# Patient Record
Sex: Female | Born: 1960 | Race: White | Hispanic: No | Smoking: Former smoker
Health system: Southern US, Community
[De-identification: ages and names within clinical notes are randomized; demographics above are authoritative.]

## PROBLEM LIST (undated history)

## (undated) ENCOUNTER — Inpatient Hospital Stay (HOSPITAL_COMMUNITY): Payer: PRIVATE HEALTH INSURANCE

## (undated) DIAGNOSIS — G4733 Obstructive sleep apnea (adult) (pediatric): Principal | ICD-10-CM

## (undated) DIAGNOSIS — J302 Other seasonal allergic rhinitis: Secondary | ICD-10-CM

## (undated) HISTORY — DX: Other seasonal allergic rhinitis: J30.2

## (undated) HISTORY — DX: Obstructive sleep apnea (adult) (pediatric): G47.33

## (undated) HISTORY — PX: CHOLECYSTECTOMY: SHX55

---

## 1997-10-13 ENCOUNTER — Other Ambulatory Visit: Admission: RE | Admit: 1997-10-13 | Discharge: 1997-10-13 | Payer: Self-pay | Admitting: Obstetrics and Gynecology

## 1999-03-11 ENCOUNTER — Other Ambulatory Visit: Admission: RE | Admit: 1999-03-11 | Discharge: 1999-03-11 | Payer: Self-pay | Admitting: Obstetrics and Gynecology

## 2000-04-20 ENCOUNTER — Other Ambulatory Visit: Admission: RE | Admit: 2000-04-20 | Discharge: 2000-04-20 | Payer: Self-pay | Admitting: Obstetrics and Gynecology

## 2001-05-03 ENCOUNTER — Other Ambulatory Visit: Admission: RE | Admit: 2001-05-03 | Discharge: 2001-05-03 | Payer: Self-pay | Admitting: Obstetrics and Gynecology

## 2002-05-27 ENCOUNTER — Other Ambulatory Visit: Admission: RE | Admit: 2002-05-27 | Discharge: 2002-05-27 | Payer: Self-pay | Admitting: Obstetrics and Gynecology

## 2003-06-18 ENCOUNTER — Other Ambulatory Visit: Admission: RE | Admit: 2003-06-18 | Discharge: 2003-06-18 | Payer: Self-pay | Admitting: Obstetrics and Gynecology

## 2004-07-21 ENCOUNTER — Other Ambulatory Visit: Admission: RE | Admit: 2004-07-21 | Discharge: 2004-07-21 | Payer: Self-pay | Admitting: Obstetrics and Gynecology

## 2004-12-30 ENCOUNTER — Encounter: Admission: RE | Admit: 2004-12-30 | Discharge: 2004-12-30 | Payer: Self-pay | Admitting: Obstetrics and Gynecology

## 2010-02-19 ENCOUNTER — Encounter: Payer: Self-pay | Admitting: Obstetrics and Gynecology

## 2013-02-25 ENCOUNTER — Encounter (HOSPITAL_COMMUNITY): Payer: Self-pay | Admitting: *Deleted

## 2013-02-28 NOTE — H&P (Signed)
Ihor AustinCelia Wesolowski  DICTATION # [id number] CSN# [id number]   Meriel PicaHOLLAND,Abi Shoults M, MD 02/28/2013 9:04 AM

## 2013-03-06 ENCOUNTER — Encounter (HOSPITAL_COMMUNITY): Payer: Self-pay

## 2013-03-12 MED ORDER — DEXTROSE 5 % IV SOLN
2.0000 g | INTRAVENOUS | Status: DC
  Filled 2013-03-12: qty 2

## 2013-03-13 ENCOUNTER — Encounter (HOSPITAL_COMMUNITY): Payer: Self-pay | Admitting: Anesthesiology

## 2013-03-13 ENCOUNTER — Ambulatory Visit (HOSPITAL_COMMUNITY)
Admission: RE | Admit: 2013-03-13 | Discharge: 2013-03-13 | Disposition: A | Discharge status: Home / Self Care | Payer: PRIVATE HEALTH INSURANCE | Source: Ambulatory Visit | Attending: Obstetrics and Gynecology | Admitting: Obstetrics and Gynecology

## 2013-03-13 ENCOUNTER — Encounter (HOSPITAL_COMMUNITY)
Admission: RE | Disposition: A | Discharge status: Home / Self Care | Payer: Self-pay | Source: Ambulatory Visit | Attending: Obstetrics and Gynecology

## 2013-03-13 SURGERY — DILATATION & CURETTAGE/HYSTEROSCOPY WITH TRUCLEAR
Anesthesia: Choice

## 2013-03-13 MED ORDER — LACTATED RINGERS IV SOLN: INTRAVENOUS | Status: DC

## 2013-03-13 SURGICAL SUPPLY — 21 items
ABLATOR ENDOMETRIAL BIPOLAR (ABLATOR) ×1 IMPLANT
BLADE INCISOR TRUC PLUS 2.9 (ABLATOR) IMPLANT
CANISTERS HI-FLOW 3000CC (CANNISTER) IMPLANT
CATH ROBINSON RED A/P 16FR (CATHETERS) ×1 IMPLANT
CLOTH BEACON ORANGE TIMEOUT ST (SAFETY) ×1 IMPLANT
CONTAINER PREFILL 10% NBF 60ML (FORM) ×2 IMPLANT
DRAPE HYSTEROSCOPY (DRAPE) ×1 IMPLANT
DRSG TELFA 3X8 NADH (GAUZE/BANDAGES/DRESSINGS) IMPLANT
GLOVE BIO SURGEON STRL SZ7 (GLOVE) ×2 IMPLANT
GOWN STRL REUS W/TWL LRG LVL3 (GOWN DISPOSABLE) ×2 IMPLANT
INCISOR TRUC PLUS BLADE 2.9 (ABLATOR)
KIT HYSTEROSCOPY TRUCLEAR (ABLATOR) IMPLANT
MORCELLATOR RECIP TRUCLEAR 4.0 (ABLATOR) IMPLANT
NDL SPNL 22GX3.5 QUINCKE BK (NEEDLE) ×1 IMPLANT
NEEDLE SPNL 22GX3.5 QUINCKE BK (NEEDLE) IMPLANT
PACK VAGINAL MINOR WOMEN LF (CUSTOM PROCEDURE TRAY) ×1 IMPLANT
PAD DRESSING TELFA 3X8 NADH (GAUZE/BANDAGES/DRESSINGS) ×1 IMPLANT
PAD OB MATERNITY 4.3X12.25 (PERSONAL CARE ITEMS) ×1 IMPLANT
SYR CONTROL 10ML LL (SYRINGE) ×1 IMPLANT
TOWEL OR 17X24 6PK STRL BLUE (TOWEL DISPOSABLE) ×2 IMPLANT
WATER STERILE IRR 1000ML POUR (IV SOLUTION) ×1 IMPLANT

## 2013-03-13 NOTE — Anesthesia Preprocedure Evaluation (Deleted)
Anesthesia Evaluation Anesthesia Physical Anesthesia Plan  ASA:   Anesthesia Plan:    Post-op Pain Management:    Induction:   Airway Management Planned:   Additional Equipment:   Intra-op Plan:   Post-operative Plan:   Informed Consent:   Plan Discussed with:   Anesthesia Plan Comments: (Case cancelled. Pt on phenteramine for weight loss. Current recommendations are to be off this drug 1 week prior to surgery to avoid hypertensive crisis)        Anesthesia Quick Evaluation

## 2013-03-13 NOTE — Progress Notes (Signed)
Pt surgery cancelled per Dr. Rosana Hoesarnigan. Pt has not been off phentermine for two weeks

## 2013-03-18 ENCOUNTER — Encounter (HOSPITAL_COMMUNITY): Payer: Self-pay

## 2013-03-20 NOTE — H&P (Signed)
NAMIhor Austin:  Rhonda Potts, Rhonda Potts                   ACCOUNT NO.:  [id number]  MEDICAL RECORD NO.:  [id number]  LOCATION:                            FACILITY:  [facility]  PHYSICIAN:  Duke Salviaichard M. Marcelle OverlieHolland, M.D.DATE OF BIRTH:  08/06/1960  DATE OF ADMISSION:  03/13/2013 DATE OF DISCHARGE:                             HISTORY & PHYSICAL   CHIEF COMPLAINT:  Menorrhagia.  HPI:  A 53 year old, divorced white female, G2, P2, is not currently sexually active.  She has been followed over the last several years in our office for routine exams by our nurse practitioner, with complaints of worsening problems related to heavy periods in October of 2014.  FSH was checked was 5.4 with thyroid profile that was normal.  SHT performed on February 13, 2013, demonstrated several small polyps noted.  She presents now for D and C, hysteroscopy, with Truclear resection of endometrial polyps and NovaSure endometrial ablation.  This procedure was discussed including specific risks related to bleeding, infection, other complications that may require additional surgery along with her expected recovery time.  PAST MEDICAL HISTORY:  ALLERGIES:  KEFLEX.  CURRENT MEDICATIONS:  Over-the-counter vitamin supplements.  PAST SURGICAL HISTORY:  Two prior cesarean sections in 1990 and 1995, cholecystectomy in 1995.  REVIEW OF SYSTEMS:  Significant for past history of HSV, UTI, and gallbladder disease.  FAMILY HISTORY:  Significant for heart disease, asthma, thyroid disease, arthritis, and hypertension.  SOCIAL HISTORY:  She is divorced.  Denies tobacco or drug use, social alcohol use.  PHYSICAL EXAMINATION:  VITAL SIGNS:  Temp 98.8, blood pressure 106/72. HEENT:  Unremarkable. NECK:  Supple without masses. LUNGS:  Clear. CARDIOVASCULAR:  Regular rate and rhythm without murmurs, rubs, gallops. BREASTS:  Without masses. ABDOMEN:  Soft, flat, nontender. PELVIS:  Vulva, vagina, cervix normal.  Uterus mid position, normal size,  mobile, adnexa negative. EXTREMITIES:  Unremarkable. NEUROLOGIC:  Unremarkable.  IMPRESSION:  Menorrhagia with endometrial polyps noted.  PLAN:  D and C, hysteroscopy, NovaSure endometrial ablation procedure and risks discussed as above.     Rhonda Potts M. Marcelle OverlieHolland, M.D.     RMH/MEDQ  D:  02/28/2013  T:  03/01/2013  Job:  [id number]

## 2013-03-24 ENCOUNTER — Encounter (HOSPITAL_COMMUNITY): Payer: Self-pay | Admitting: Pharmacist

## 2013-04-03 MED ORDER — GENTAMICIN SULFATE 40 MG/ML IJ SOLN
INTRAVENOUS | Status: AC
  Administered 2013-04-04: 320 mL via INTRAVENOUS
  Filled 2013-04-03: qty 8

## 2013-04-04 ENCOUNTER — Encounter (HOSPITAL_COMMUNITY): Payer: Self-pay | Admitting: Anesthesiology

## 2013-04-04 ENCOUNTER — Encounter (HOSPITAL_COMMUNITY)
Admission: RE | Disposition: A | Discharge status: Home / Self Care | Payer: Self-pay | Source: Ambulatory Visit | Attending: Obstetrics and Gynecology

## 2013-04-04 ENCOUNTER — Ambulatory Visit (HOSPITAL_COMMUNITY): Payer: PRIVATE HEALTH INSURANCE | Admitting: Anesthesiology

## 2013-04-04 ENCOUNTER — Encounter (HOSPITAL_COMMUNITY): Payer: PRIVATE HEALTH INSURANCE | Admitting: Anesthesiology

## 2013-04-04 ENCOUNTER — Ambulatory Visit (HOSPITAL_COMMUNITY)
Admission: RE | Admit: 2013-04-04 | Discharge: 2013-04-04 | Disposition: A | Discharge status: Home / Self Care | Payer: PRIVATE HEALTH INSURANCE | Source: Ambulatory Visit | Attending: Obstetrics and Gynecology | Admitting: Obstetrics and Gynecology

## 2013-04-04 DIAGNOSIS — N840 Polyp of corpus uteri: Secondary | ICD-10-CM | POA: Insufficient documentation

## 2013-04-04 DIAGNOSIS — N920 Excessive and frequent menstruation with regular cycle: Secondary | ICD-10-CM | POA: Insufficient documentation

## 2013-04-04 HISTORY — PX: DILATATION & CURETTAGE/HYSTEROSCOPY WITH TRUECLEAR: SHX6353

## 2013-04-04 HISTORY — PX: NOVASURE ABLATION: SHX5394

## 2013-04-04 LAB — CBC
HEMATOCRIT: 35.2 % — AB (ref 36.0–46.0)
HEMOGLOBIN: 10.9 g/dL — AB (ref 12.0–15.0)
MCH: 25.6 pg — ABNORMAL LOW (ref 26.0–34.0)
MCHC: 31 g/dL (ref 30.0–36.0)
MCV: 82.8 fL (ref 78.0–100.0)
Platelets: 210 10*3/uL (ref 150–400)
RBC: 4.25 MIL/uL (ref 3.87–5.11)
RDW: 14.1 % (ref 11.5–15.5)
WBC: 5.6 10*3/uL (ref 4.0–10.5)

## 2013-04-04 LAB — PREGNANCY, URINE: PREG TEST UR: NEGATIVE

## 2013-04-04 SURGERY — DILATATION & CURETTAGE/HYSTEROSCOPY WITH TRUCLEAR
Anesthesia: General | Site: Vagina

## 2013-04-04 MED ORDER — PROPOFOL 10 MG/ML IV EMUL
INTRAVENOUS | Status: AC
  Filled 2013-04-04: qty 20

## 2013-04-04 MED ORDER — LIDOCAINE HCL (CARDIAC) 20 MG/ML IV SOLN
INTRAVENOUS | Status: DC | PRN
  Administered 2013-04-04: 40 mg via INTRAVENOUS

## 2013-04-04 MED ORDER — LIDOCAINE HCL 1 % IJ SOLN
INTRAMUSCULAR | Status: AC
  Filled 2013-04-04: qty 40

## 2013-04-04 MED ORDER — MIDAZOLAM HCL 2 MG/2ML IJ SOLN
INTRAMUSCULAR | Status: AC
  Filled 2013-04-04: qty 2

## 2013-04-04 MED ORDER — FENTANYL CITRATE 0.05 MG/ML IJ SOLN
INTRAMUSCULAR | Status: AC
  Filled 2013-04-04: qty 2

## 2013-04-04 MED ORDER — LACTATED RINGERS IR SOLN
Status: DC | PRN
  Administered 2013-04-04: 3000 mL

## 2013-04-04 MED ORDER — ONDANSETRON HCL 4 MG/2ML IJ SOLN
INTRAMUSCULAR | Status: DC | PRN
  Administered 2013-04-04: 4 mg via INTRAVENOUS

## 2013-04-04 MED ORDER — LIDOCAINE HCL (CARDIAC) 20 MG/ML IV SOLN
INTRAVENOUS | Status: AC
  Filled 2013-04-04: qty 5

## 2013-04-04 MED ORDER — ONDANSETRON HCL 4 MG/2ML IJ SOLN
INTRAMUSCULAR | Status: AC
  Filled 2013-04-04: qty 2

## 2013-04-04 MED ORDER — HYDROCODONE-IBUPROFEN 7.5-200 MG PO TABS: 1.0000 | ORAL_TABLET | Freq: Three times a day (TID) | ORAL | Status: DC | PRN

## 2013-04-04 MED ORDER — KETOROLAC TROMETHAMINE 30 MG/ML IJ SOLN
INTRAMUSCULAR | Status: DC | PRN
  Administered 2013-04-04: 30 mg via INTRAVENOUS

## 2013-04-04 MED ORDER — FENTANYL CITRATE 0.05 MG/ML IJ SOLN
INTRAMUSCULAR | Status: DC | PRN
  Administered 2013-04-04 (×2): 50 ug via INTRAVENOUS

## 2013-04-04 MED ORDER — SILVER NITRATE-POT NITRATE 75-25 % EX MISC
CUTANEOUS | Status: DC | PRN
  Administered 2013-04-04: 2

## 2013-04-04 MED ORDER — MIDAZOLAM HCL 2 MG/2ML IJ SOLN
INTRAMUSCULAR | Status: DC | PRN
  Administered 2013-04-04: 2 mg via INTRAVENOUS

## 2013-04-04 MED ORDER — LACTATED RINGERS IV SOLN
INTRAVENOUS | Status: DC
  Administered 2013-04-04: 08:00:00 via INTRAVENOUS

## 2013-04-04 MED ORDER — SODIUM CHLORIDE 0.9 % IR SOLN
Status: DC | PRN
  Administered 2013-04-04: 3000 mL

## 2013-04-04 MED ORDER — LIDOCAINE HCL 1 % IJ SOLN
INTRAMUSCULAR | Status: DC | PRN
  Administered 2013-04-04: 10 mL

## 2013-04-04 MED ORDER — PROPOFOL 10 MG/ML IV BOLUS
INTRAVENOUS | Status: DC | PRN
  Administered 2013-04-04: 170 mg via INTRAVENOUS

## 2013-04-04 MED ORDER — KETOROLAC TROMETHAMINE 30 MG/ML IJ SOLN
INTRAMUSCULAR | Status: AC
  Filled 2013-04-04: qty 1

## 2013-04-04 SURGICAL SUPPLY — 21 items
ABLATOR ENDOMETRIAL BIPOLAR (ABLATOR) ×2 IMPLANT
BLADE INCISOR TRUC PLUS 2.9 (ABLATOR) IMPLANT
CANISTERS HI-FLOW 3000CC (CANNISTER) ×2 IMPLANT
CATH ROBINSON RED A/P 16FR (CATHETERS) ×2 IMPLANT
CLOTH BEACON ORANGE TIMEOUT ST (SAFETY) ×2 IMPLANT
CONTAINER PREFILL 10% NBF 60ML (FORM) ×4 IMPLANT
DRAPE HYSTEROSCOPY (DRAPE) ×2 IMPLANT
DRSG TELFA 3X8 NADH (GAUZE/BANDAGES/DRESSINGS) ×2 IMPLANT
GLOVE BIO SURGEON STRL SZ7 (GLOVE) ×4 IMPLANT
GOWN STRL REUS W/TWL LRG LVL3 (GOWN DISPOSABLE) ×4 IMPLANT
INCISOR TRUC PLUS BLADE 2.9 (ABLATOR) ×2
KIT HYSTEROSCOPY TRUCLEAR (ABLATOR) ×1 IMPLANT
MORCELLATOR RECIP TRUCLEAR 4.0 (ABLATOR) IMPLANT
NDL SPNL 22GX3.5 QUINCKE BK (NEEDLE) ×1 IMPLANT
NEEDLE SPNL 22GX3.5 QUINCKE BK (NEEDLE) ×2 IMPLANT
PACK VAGINAL MINOR WOMEN LF (CUSTOM PROCEDURE TRAY) ×2 IMPLANT
PAD DRESSING TELFA 3X8 NADH (GAUZE/BANDAGES/DRESSINGS) ×1 IMPLANT
PAD OB MATERNITY 4.3X12.25 (PERSONAL CARE ITEMS) ×2 IMPLANT
SYR CONTROL 10ML LL (SYRINGE) ×2 IMPLANT
TOWEL OR 17X24 6PK STRL BLUE (TOWEL DISPOSABLE) ×4 IMPLANT
WATER STERILE IRR 1000ML POUR (IV SOLUTION) ×2 IMPLANT

## 2013-04-04 NOTE — Transfer of Care (Signed)
Immediate Anesthesia Transfer of Care Note  Patient: Rhonda Potts  Procedure(s) Performed: Procedure(s): DILATATION & CURETTAGE/HYSTEROSCOPY WITH TRUCLEAR (N/A) NOVASURE ABLATION (N/A)  Patient Location: PACU  Anesthesia Type:General  Level of Consciousness: awake, alert , oriented and patient cooperative  Airway & Oxygen Therapy: Patient Spontanous Breathing and Patient connected to nasal cannula oxygen  Post-op Assessment: Report given to PACU RN and Post -op Vital signs reviewed and stable  Post vital signs: Reviewed and stable  Complications: No apparent anesthesia complications

## 2013-04-04 NOTE — Progress Notes (Signed)
The patient was re-examined with no change in status 

## 2013-04-04 NOTE — Op Note (Signed)
Preoperative diagnosis: Endometrial polyp, menorrhagia  Postoperative diagnosis: Same  Procedure: D&C, hysteroscopy, resection of endometrial polyp with true clear, NovaSure endometrial ablation  Surgeon: Marcelle OverlieHolland  Anesthesia: Gen.  EBL: Less than 10 cc  Specimens removed: Endometrial polyp, to pathology  Procedure and findings:  The patient was taken the operating room after an adequate level of general anesthesia was obtained with the patient's legs in stirrups the perineum and vagina prepped and draped in the usual fashion for D&C. The bladder was drained. EUA carried out, uterus upper limit normal size mid position, adnexa negative. Appropriate timeout for taken at that point.  Speculum was positioned, cervix was grasped with a tenaculum, paracervical block was then created by infiltrating at 3 and 9:00 submucosally 5-7 cc 1% Xylocaine at each site after negative aspiration. The uterus is then sounded to 10 cm, progressively dilated to a 27-29 Pratt dilator, the continuous flow hysteroscope was inserted, the tubal ostia could be seen easily, endometrium was very thin except for the presence of a well-defined left cornual polyp, tissue extractor was then used to remove this down to the surrounding level of the endometrium this was sent to pathology the scope was pulled back for a panoramic view no other abnormalities were noted. The NovaSure device was then inserted with a cavity length of 6.5, initial appointment was reading right a 2.5 on the with, this was retracted reinserted easily to see if we could duplicate the width measurement and in fact it was only with only deployed to right a 2.5 cm. Past the CO2 testing, treatment cycle well-tolerated the instrument was removed, she tolerated this well received IV Toradol went to recovery room in good condition.  Dictated with [facility]  Tierre Netto M. Milana ObeyHolland M.D.

## 2013-04-04 NOTE — Discharge Instructions (Signed)

## 2013-04-04 NOTE — Anesthesia Preprocedure Evaluation (Addendum)

## 2013-04-04 NOTE — Anesthesia Postprocedure Evaluation (Signed)
  Anesthesia Post-op Note  Patient: Rhonda Potts  Procedure(s) Performed: Procedure(s): DILATATION & CURETTAGE/HYSTEROSCOPY WITH TRUCLEAR (N/A) NOVASURE ABLATION (N/A) Patient is awake and responsive. Pain and nausea are reasonably well controlled. Vital signs are stable and clinically acceptable. Oxygen saturation is clinically acceptable. There are no apparent anesthetic complications at this time. Patient is ready for discharge.

## 2013-04-07 ENCOUNTER — Encounter (HOSPITAL_COMMUNITY): Payer: Self-pay | Admitting: Obstetrics and Gynecology

## 2013-05-08 NOTE — H&P (Signed)
HISTORY & PHYSICAL  CHIEF COMPLAINT: Menorrhagia.  HPI: A 53 year old, divorced white female, G2, P2, is not currently  sexually active. She has been followed over the last several years in  our office for routine exams by our nurse practitioner, with complaints  of worsening problems related to heavy periods in October of 2014. FSH  was checked was 5.4 with thyroid profile that was normal. SHT performed  on February 13, 2013, demonstrated several small polyps noted. She  presents now for D and C, hysteroscopy, with Truclear resection of  endometrial polyps and NovaSure endometrial ablation. This procedure  was discussed including specific risks related to bleeding, infection,  other complications that may require additional surgery along with her  expected recovery time.  PAST MEDICAL HISTORY:  ALLERGIES: KEFLEX.  CURRENT MEDICATIONS: Over-the-counter vitamin supplements.  PAST SURGICAL HISTORY: Two prior cesarean sections in 1990 and 1995,  cholecystectomy in 1995.  REVIEW OF SYSTEMS: Significant for past history of HSV, UTI, and  gallbladder disease.  FAMILY HISTORY: Significant for heart disease, asthma, thyroid disease,  arthritis, and hypertension.  SOCIAL HISTORY: She is divorced. Denies tobacco or drug use, social  alcohol use.  PHYSICAL EXAMINATION: VITAL SIGNS: Temp 98.8, blood pressure 106/72.  HEENT: Unremarkable.  NECK: Supple without masses.  LUNGS: Clear.  CARDIOVASCULAR: Regular rate and rhythm without murmurs, rubs, gallops.  BREASTS: Without masses.  ABDOMEN: Soft, flat, nontender.  PELVIS: Vulva, vagina, cervix normal. Uterus mid position, normal  size, mobile, adnexa negative.  EXTREMITIES: Unremarkable.  NEUROLOGIC: Unremarkable.  IMPRESSION: Menorrhagia with endometrial polyps noted.  PLAN: D and C, hysteroscopy, NovaSure endometrial ablation procedure  and risks discussed as above.  Rhonda Potts, M.D.

## 2013-12-09 ENCOUNTER — Other Ambulatory Visit: Payer: Self-pay | Admitting: Obstetrics and Gynecology

## 2013-12-10 LAB — CYTOLOGY - PAP

## 2014-02-12 ENCOUNTER — Encounter (HOSPITAL_COMMUNITY): Payer: Self-pay | Admitting: Obstetrics and Gynecology

## 2014-03-01 ENCOUNTER — Ambulatory Visit (INDEPENDENT_AMBULATORY_CARE_PROVIDER_SITE_OTHER): Payer: 59 | Admitting: Family Medicine

## 2014-03-01 VITALS — BP 102/66 | HR 83 | Temp 98.4°F | Resp 18 | Ht 63.5 in | Wt 164.8 lb

## 2014-03-01 DIAGNOSIS — R300 Dysuria: Secondary | ICD-10-CM

## 2014-03-01 LAB — POCT URINALYSIS DIPSTICK
Bilirubin, UA: NEGATIVE
Glucose, UA: NEGATIVE
Ketones, UA: NEGATIVE
Nitrite, UA: NEGATIVE
Protein, UA: NEGATIVE
Spec Grav, UA: <=1.005
Urobilinogen, UA: 0.2
pH, UA: 6

## 2014-03-01 LAB — POCT UA - MICROSCOPIC ONLY
Casts, Ur, LPF, POC: NEGATIVE
Crystals, Ur, HPF, POC: NEGATIVE
Mucus, UA: NEGATIVE
Yeast, UA: NEGATIVE

## 2014-03-01 MED ORDER — CIPROFLOXACIN HCL 500 MG PO TABS: 500.0000 mg | ORAL_TABLET | Freq: Two times a day (BID) | ORAL | Status: DC

## 2014-03-01 NOTE — Patient Instructions (Signed)

## 2014-03-01 NOTE — Progress Notes (Addendum)
° °  Subjective:    Patient ID: Rhonda Potts, female    DOB: [birth year], 54 y.o.   MRN: [id number] This chart was scribed for Elvina SidleKurt Lauenstein, MD by Littie Deedsichard Sun, Medical Scribe. This patient was seen in Room 3 and the patient's care was started at 10:00 AM.   HPI HPI Comments: Rhonda Potts is a 54 y.o. female who presents to the [facility] complaining of hematuria with clotting that started yesterday morning. Patient also reports having associated lower back pain (worse on right side) and also felt lethargic yesterday. Her symptoms have been pro worsening since yesterday. She has not tried any treatments. Patient denies nausea, vomiting, fever, and chills. She states she has had UTIs before, but her symptoms now do not feel like UTIs in the past - she had not had clotting before and she normally gets burning dysuria with UTI.  Patient works in [employer]. She is supposed to go out-of-town tomorrow for 3 days for work.   Review of Systems  Constitutional: Positive for fatigue. Negative for fever and chills.  Gastrointestinal: Negative for nausea and vomiting.  Genitourinary: Positive for hematuria.  Musculoskeletal: Positive for back pain.       Objective:   Physical Exam CONSTITUTIONAL: Well developed/well nourished HEAD: Normocephalic/atraumatic EYES: EOM/PERRL ENMT: Mucous membranes moist NECK: supple no meningeal signs SPINE: entire spine nontender  ABDOMEN: soft, nontender, no rebound or guarding GU: no cva tenderness NEURO: Pt is awake/alert, moves all extremitiesx4 EXTREMITIES: pulses normal, full ROM SKIN: warm, color normal PSYCH: no abnormalities of mood noted Results for orders placed or performed in visit on 03/01/14  POCT urinalysis dipstick  Result Value Ref Range   Color, UA light yellow    Clarity, UA cloudy    Glucose, UA neg    Bilirubin, UA neg    Ketones, UA neg    Spec Grav, UA <=1.005    Blood, UA large    pH, UA 6.0    Protein, UA neg    Urobilinogen, UA 0.2    Nitrite, UA neg    Leukocytes, UA moderate (2+)   POCT UA - Microscopic Only  Result Value Ref Range   WBC, Ur, HPF, POC TNTC    RBC, urine, microscopic 15-20    Bacteria, U Microscopic 1+    Mucus, UA neg    Epithelial cells, urine per micros 2-5    Crystals, Ur, HPF, POC neg    Casts, Ur, LPF, POC neg    Yeast, UA neg         Assessment & Plan:   This chart was scribed in my presence and reviewed by me personally.    ICD-9-CM ICD-10-CM   1. Dysuria 788.1 R30.0 POCT urinalysis dipstick     POCT UA - Microscopic Only     Urine culture     ciprofloxacin (CIPRO) 500 MG tablet     Signed, Elvina SidleKurt Lauenstein, MD

## 2014-03-03 LAB — URINE CULTURE: Colony Count: 75000

## 2014-05-21 ENCOUNTER — Ambulatory Visit (INDEPENDENT_AMBULATORY_CARE_PROVIDER_SITE_OTHER): Payer: 59 | Admitting: Physician Assistant

## 2014-05-21 VITALS — BP 102/70 | HR 75 | Temp 98.1°F | Resp 18 | Ht 64.5 in | Wt 160.0 lb

## 2014-05-21 DIAGNOSIS — M79644 Pain in right finger(s): Secondary | ICD-10-CM | POA: Diagnosis not present

## 2014-05-21 DIAGNOSIS — L02511 Cutaneous abscess of right hand: Secondary | ICD-10-CM | POA: Diagnosis not present

## 2014-05-21 MED ORDER — DOXYCYCLINE HYCLATE 100 MG PO CAPS: 100.0000 mg | ORAL_CAPSULE | Freq: Two times a day (BID) | ORAL | Status: DC

## 2014-05-21 NOTE — Progress Notes (Signed)
  Medical screening examination/treatment/procedure(s) were performed by non-physician practitioner and as supervising physician I was immediately available for consultation/collaboration.     

## 2014-05-21 NOTE — Progress Notes (Signed)
   Subjective:    Patient ID: Rhonda Potts, female    DOB: [birth year], 54 y.o.   MRN: [id number]  HPI Patient presents for possible finger infection of middle finger of right hand that started 1 week ago, but has become more red and painful over the past 3 days. Pain does not radiate. Recalls fingernail tearing down to cuticle 1 week ago. Finger is now warm to touch and a little swollen. Has been soaking finger in vinegar and water with not change. Worse throughout today due to working on project and has been doing a lot of typing. Denies loss of function/sensation/ROM, numbness, or weakness.    Review of Systems  Constitutional: Negative for fever and chills.  Skin: Positive for color change and wound. Negative for pallor.  Neurological: Negative for weakness and numbness.       Objective:   Physical Exam  Constitutional: She is oriented to person, place, and time. She appears well-developed and well-nourished. No distress.  Blood pressure 102/70, pulse 75, temperature 98.1 F (36.7 C), temperature source Oral, resp. rate 18, height 5' 4.5" (1.638 m), weight 160 lb (72.576 kg), last menstrual period 05/13/2014, SpO2 99 %.  HENT:  Head: Normocephalic and atraumatic.  Right Ear: External ear normal.  Left Ear: External ear normal.  Eyes: Conjunctivae are normal. Right eye exhibits no discharge. Left eye exhibits no discharge.  Neck: Neck supple.  Pulmonary/Chest: Effort normal.  Lymphadenopathy:    She has no cervical adenopathy.  Neurological: She is alert and oriented to person, place, and time.  Skin: Skin is warm and dry. No rash noted. She is not diaphoretic. No erythema. No pallor.  Middle finger of right hand: Tip of finger erythematous to DIP and warm to touch. Tender to palpation. Small area of fluctuance along lateral border of nail. Purulence expressed with deep palpation.    Procedure Consent obtained. 3cc 1% lido metacarpal block 1/4 cc local anesthesia. 1/2 cm incision  made. Purulence expressed and culture obtained. Wound explored. 1/4 plain packing placed. Clean dressing placed. Care instructions given.    Assessment & Plan:  1. Abscess of finger, right 2. Finger pain, right Warm compress 3-4x daily for 15 minutes. Ibuprofen for pain. RTC 05/23/14 for wound care. - doxycycline (VIBRAMYCIN) 100 MG capsule; Take 1 capsule (100 mg total) by mouth 2 (two) times daily.  Dispense: 20 capsule; Refill: 0 - Wound culture    Janan Ridgeishira Kimaria Struthers PA-C  [facility] 05/21/2014 3:41 PM

## 2014-05-21 NOTE — Patient Instructions (Signed)
Ibuprofen for pain. Heating pad/compress 15-20 min 3-4 times daily.

## 2014-05-24 ENCOUNTER — Ambulatory Visit (INDEPENDENT_AMBULATORY_CARE_PROVIDER_SITE_OTHER): Payer: 59 | Admitting: Internal Medicine

## 2014-05-24 VITALS — BP 118/60 | HR 82 | Temp 98.0°F | Resp 18

## 2014-05-24 DIAGNOSIS — L089 Local infection of the skin and subcutaneous tissue, unspecified: Secondary | ICD-10-CM | POA: Diagnosis not present

## 2014-05-24 DIAGNOSIS — S61202A Unspecified open wound of right middle finger without damage to nail, initial encounter: Secondary | ICD-10-CM

## 2014-05-24 DIAGNOSIS — S61209D Unspecified open wound of unspecified finger without damage to nail, subsequent encounter: Secondary | ICD-10-CM

## 2014-05-24 DIAGNOSIS — B958 Unspecified staphylococcus as the cause of diseases classified elsewhere: Secondary | ICD-10-CM

## 2014-05-24 LAB — WOUND CULTURE
GRAM STAIN: NONE SEEN
Gram Stain: NONE SEEN

## 2014-05-24 MED ORDER — MUPIROCIN 2 % EX OINT: 1.0000 "application " | TOPICAL_OINTMENT | Freq: Three times a day (TID) | CUTANEOUS | Status: DC

## 2014-05-24 NOTE — Progress Notes (Signed)
   Subjective:    Patient ID: Rhonda Potts, female    DOB: Jul 06, 1960, 54 y.o.   MRN: [id number]  HPI Post ID abscess distal right middle finger, paronychia. Improved, on doxycycline.    Review of Systems     Objective:   Physical Exam  Constitutional: She is oriented to person, place, and time. She appears well-developed and well-nourished.  HENT:  Head: Normocephalic.  Eyes: EOM are normal.  Neck: Normal range of motion.  Pulmonary/Chest: Effort normal.  Musculoskeletal: She exhibits tenderness.  Neurological: She is alert and oriented to person, place, and time. She exhibits normal muscle tone. Coordination normal.  Skin: There is erythema.  Psychiatric: She has a normal mood and affect.  Wound oozing sero sanguinous transudate.  Less red and swollen         Assessment & Plan:  Mupirocin ointment dressing

## 2014-05-24 NOTE — Patient Instructions (Signed)
Immunization Schedule, Adult  Influenza vaccine.  All adults should be immunized every year.  All adults, including pregnant women and people with hives-only allergy to eggs can receive the inactivated influenza (IIV) vaccine.  Adults aged 55-49 years can receive the recombinant influenza (RIV) vaccine. The RIV vaccine does not contain any egg protein.  Adults aged 22 years or older can receive the standard-dose IIV or the high-dose IIV.  Tetanus, diphtheria, and acellular pertussis (Td, Tdap) vaccine.  Pregnant women should receive 1 dose of Tdap vaccine during each pregnancy. The dose should be obtained regardless of the length of time since the last dose. Immunization is preferred during the 27th to 36th week of gestation.  An adult who has not previously received Tdap or who does not know his or her vaccine status should receive 1 dose of Tdap. This initial dose should be followed by tetanus and diphtheria toxoids (Td) booster doses every 10 years.  Adults with an unknown or incomplete history of completing a 3-dose immunization series with Td-containing vaccines should begin or complete a primary immunization series including a Tdap dose.  Adults should receive a Td booster every 10 years.  Varicella vaccine.  An adult without evidence of immunity to varicella should receive 2 doses or a second dose if he or she has previously received 1 dose.  Pregnant females who do not have evidence of immunity should receive the first dose after pregnancy. This first dose should be obtained before leaving the health care facility. The second dose should be obtained 4-8 weeks after the first dose.  Human papillomavirus (HPV) vaccine.  Females aged 13-26 years who have not received the vaccine previously should obtain the 3-dose series.  The vaccine is not recommended for use in pregnant females. However, pregnancy testing is not needed before receiving a dose. If a female is found to be  pregnant after receiving a dose, no treatment is needed. In that case, the remaining doses should be delayed until after the pregnancy.  Males aged 32-21 years who have not received the vaccine previously should receive the 3-dose series. Males aged 22-26 years may be immunized.  Immunization is recommended through the age of 43 years for any female who has sex with males and did not get any or all doses earlier.  Immunization is recommended for any person with an immunocompromised condition through the age of 57 years if he or she did not get any or all doses earlier.  During the 3-dose series, the second dose should be obtained 4-8 weeks after the first dose. The third dose should be obtained 24 weeks after the first dose and 16 weeks after the second dose.  Zoster vaccine.  One dose is recommended for adults aged 62 years or older unless certain conditions are present.  Measles, mumps, and rubella (MMR) vaccine.  Adults born before 2 generally are considered immune to measles and mumps.  Adults born in [birth year] or later should have 1 or more doses of MMR vaccine unless there is a contraindication to the vaccine or there is laboratory evidence of immunity to each of the three diseases.  A routine second dose of MMR vaccine should be obtained at least 28 days after the first dose for students attending postsecondary schools, health care workers, or international travelers.  People who received inactivated measles vaccine or an unknown type of measles vaccine during 1963-1967 should receive 2 doses of MMR vaccine.  People who received inactivated mumps vaccine or an unknown type  of mumps vaccine before 1979 and are at high risk for mumps infection should consider immunization with 2 doses of MMR vaccine.  For females of childbearing age, rubella immunity should be determined. If there is no evidence of immunity, females who are not pregnant should be vaccinated. If there is no evidence of  immunity, females who are pregnant should delay immunization until after pregnancy.  Unvaccinated health care workers born before [birth year] who lack laboratory evidence of measles, mumps, or rubella immunity or laboratory confirmation of disease should consider measles and mumps immunization with 2 doses of MMR vaccine or rubella immunization with 1 dose of MMR vaccine.  Pneumococcal 13-valent conjugate (PCV13) vaccine.  When indicated, a person who is uncertain of his or her immunization history and has no record of immunization should receive the PCV13 vaccine.  An adult aged 48 years or older who has certain medical conditions and has not been previously immunized should receive 1 dose of PCV13 vaccine. This PCV13 should be followed with a dose of pneumococcal polysaccharide (PPSV23) vaccine. The PPSV23 vaccine dose should be obtained at least 8 weeks after the dose of PCV13 vaccine.  An adult aged 56 years or older who has certain medical conditions and previously received 1 or more doses of PPSV23 vaccine should receive 1 dose of PCV13. The PCV13 vaccine dose should be obtained 1 or more years after the last PPSV23 vaccine dose.  Pneumococcal polysaccharide (PPSV23) vaccine.  When PCV13 is also indicated, PCV13 should be obtained first.  All adults aged 36 years and older should be immunized.  An adult younger than age 79 years who has certain medical conditions should be immunized.  Any person who resides in a nursing home or long-term care facility should be immunized.  An adult smoker should be immunized.  People with an immunocompromised condition and certain other conditions should receive both PCV13 and PPSV23 vaccines.  People with human immunodeficiency virus (HIV) infection should be immunized as soon as possible after diagnosis.  Immunization during chemotherapy or radiation therapy should be avoided.  Routine use of PPSV23 vaccine is not recommended for American Indians,  [place] Natives, or people younger than 65 years unless there are medical conditions that require PPSV23 vaccine.  When indicated, people who have unknown immunization and have no record of immunization should receive PPSV23 vaccine.  One-time revaccination 5 years after the first dose of PPSV23 is recommended for people aged 19-64 years who have chronic kidney failure, nephrotic syndrome, asplenia, or immunocompromised conditions.  People who received 1-2 doses of PPSV23 before age 52 years should receive another dose of PPSV23 vaccine at age 26 years or later if at least 5 years have passed since the previous dose.  Doses of PPSV23 are not needed for people immunized with PPSV23 at or after age 67 years.  Meningococcal vaccine.  Adults with asplenia or persistent complement component deficiencies should receive 2 doses of quadrivalent meningococcal conjugate (MenACWY-D) vaccine. The doses should be obtained at least 2 months apart.  Microbiologists working with certain meningococcal bacteria, [employer] recruits, people at risk during an outbreak, and people who travel to or live in countries with a high rate of meningitis should be immunized.  A first-year college student up through age 15 years who is living in a residence hall should receive a dose if he or she did not receive a dose on or after his or her 16th birthday.  Adults who have certain high-risk conditions should receive one or more doses  of vaccine.  Hepatitis A vaccine.  Adults who wish to be protected from this disease, have certain high-risk conditions, work with hepatitis A-infected animals, work in hepatitis A research labs, or travel to or work in countries with a high rate of hepatitis A should be immunized.  Adults who were previously unvaccinated and who anticipate close contact with an international adoptee during the first 60 days after arrival in the [place] from a country with a high rate of hepatitis A should  be immunized.  Hepatitis B vaccine.  Adults who wish to be protected from this disease, have certain high-risk conditions, may be exposed to blood or other infectious body fluids, are household contacts or sex partners of hepatitis B positive people, are clients or workers in certain care facilities, or travel to or work in countries with a high rate of hepatitis B should be immunized.  Haemophilus influenzae type b (Hib) vaccine.  A previously unvaccinated person with asplenia or sickle cell disease or having a scheduled splenectomy should receive 1 dose of Hib vaccine.  Regardless of previous immunization, a recipient of a hematopoietic stem cell transplant should receive a 3-dose series 6-12 months after his or her successful transplant.  Hib vaccine is not recommended for adults with HIV infection. Document Released: 04/08/2003 Document Revised: 05/13/2012 Document Reviewed: 03/05/2012 [facility] Patient Information 2015 [place], [place]. This information is not intended to replace advice given to you by your health care provider. Make sure you discuss any questions you have with your health care provider.

## 2014-05-27 ENCOUNTER — Telehealth: Payer: Self-pay | Admitting: Physician Assistant

## 2014-05-27 NOTE — Telephone Encounter (Signed)
Finger has improved. Antibiotic makes her feel nauseous, but has powered through and does not want to switch or have Zofran at this time. Finger is improved without additional redness, pain, or swelling. No fever. Is doing well.

## 2015-09-23 ENCOUNTER — Ambulatory Visit (INDEPENDENT_AMBULATORY_CARE_PROVIDER_SITE_OTHER): Payer: Managed Care, Other (non HMO) | Admitting: Pulmonary Disease

## 2015-09-23 ENCOUNTER — Encounter: Payer: Self-pay | Admitting: Pulmonary Disease

## 2015-09-23 VITALS — BP 104/70 | HR 72 | Ht 64.0 in | Wt 160.6 lb

## 2015-09-23 DIAGNOSIS — R0683 Snoring: Secondary | ICD-10-CM

## 2015-09-23 NOTE — Patient Instructions (Signed)
Will arrange for home sleep study Will call to arrange for follow up after sleep study reviewed  

## 2015-09-23 NOTE — Progress Notes (Signed)
Past Surgical History She  has a past surgical history that includes Cesarean section; Cholecystectomy; Dilatation & curettage/hysteroscopy with trueclear (N/A, 04/04/2013); and Novasure ablation (N/A, 04/04/2013).  Allergies  Allergen Reactions  . Keflex [Cephalexin] Itching and Rash    Family History Her family history includes Asthma in her father; Heart disease in her father; Hypertension in her brother, father, and mother; Stroke in her father.  Social History She  reports that she quit smoking about 27 years ago. She has never used smokeless tobacco. She reports that she drinks about 3.6 oz of alcohol per week . She reports that she does not use drugs.  Review of systems Constitutional: Negative for fever and unexpected weight change.  HENT: Negative for congestion, dental problem, ear pain, nosebleeds, postnasal drip, rhinorrhea, sinus pressure, sneezing, sore throat and trouble swallowing.   Eyes: Negative for redness and itching.  Respiratory: Negative for cough, chest tightness, shortness of breath and wheezing.   Cardiovascular: Negative for palpitations and leg swelling.  Gastrointestinal: Negative for nausea and vomiting.  Genitourinary: Negative for dysuria.  Musculoskeletal: Negative for joint swelling.  Skin: Negative for rash.  Neurological: Negative for headaches.  Hematological: Does not bruise/bleed easily.  Psychiatric/Behavioral: Negative for dysphoric mood. The patient is not nervous/anxious.     No current outpatient prescriptions on file prior to visit.   No current facility-administered medications on file prior to visit.     Chief Complaint  Patient presents with  . Sleep Consult    Referred by Julio Sicksarol Curtis, NP.  Epworth Score: 10    Tests:  Past medical history She  has a past medical history of Seasonal allergies.  Vital signs BP 104/70 (BP Location: Left Arm, Cuff Size: Normal)   Pulse 72   Ht 5\' 4"  (1.626 m)   Wt 160 lb 9.6 oz (72.8 kg)    SpO2 97%   BMI 27.57 kg/m   History of Present Illness Rhonda Potts is a 55 y.o. female for evaluation of sleep problems.  She is planning a trip to [place] with friends.  During previous trips her friends have told her that she snores, and will stop breathing while asleep.  She bought a boil and bite oral appliance to help with her snoring.  She was seen by ENT and was told her sinus passage was okay, but that she should get sleep study to further assess for sleep apnea.  She goes to sleep at 1030 pm.  She falls asleep after 10 minutes.  She wakes up some times to use the bathroom.  She gets out of bed between 6 and 645 am.  She feels okay in the morning.  She denies morning headache.  She does not use anything to help her fall sleep.  She drinks hot tea in the morning.  She denies sleep walking, sleep talking, bruxism, or nightmares.  There is no history of restless legs.  She denies sleep hallucinations, sleep paralysis, or cataplexy.  The Epworth score is 10 out of 24.   Physical Exam:  General - No distress ENT - No sinus tenderness, no oral exudate, no LAN, no thyromegaly, TM clear, pupils equal/reactive, MP 4, scalloped tongue Cardiac - s1s2 regular, no murmur, pulses symmetric Chest - No wheeze/rales/dullness, good air entry, normal respiratory excursion Back - No focal tenderness Abd - Soft, non-tender, no organomegaly, + bowel sounds Ext - No edema Neuro - Normal strength, cranial nerves intact Skin - No rashes Psych - Normal mood, and behavior  Discussion:  She has snoring, sleep disruption, witnessed apnea, and daytime sleepiness.  It is possible she could have sleep apnea.  We discussed how sleep apnea can affect various health problems, including risks for hypertension, cardiovascular disease, and diabetes.  We also discussed how sleep disruption can increase risks for accidents, such as while driving.  Weight loss as a means of improving sleep apnea was also reviewed.   Additional treatment options discussed were CPAP therapy, oral appliance, and surgical intervention.  Assessment/plan:  Snoring with concern for obstructive sleep apnea. - will arrange for home sleep study, pending insurance approval - advised her to d/w her dentist about using boil and bite oral appliance for snoring   Patient Instructions  Will arrange for home sleep study Will call to arrange for follow up after sleep study reviewed    Coralyn HellingVineet Vandella Ord, M.D. Pager [phone] 09/23/2015, 9:59 AM

## 2015-09-23 NOTE — Progress Notes (Signed)
   Subjective:    Patient ID: Rhonda Potts, female    DOB: 06/02/1960, 55 y.o.   MRN: [id number]  HPI    Review of Systems  Constitutional: Negative for fever and unexpected weight change.  HENT: Negative for congestion, dental problem, ear pain, nosebleeds, postnasal drip, rhinorrhea, sinus pressure, sneezing, sore throat and trouble swallowing.   Eyes: Negative for redness and itching.  Respiratory: Negative for cough, chest tightness, shortness of breath and wheezing.   Cardiovascular: Negative for palpitations and leg swelling.  Gastrointestinal: Negative for nausea and vomiting.  Genitourinary: Negative for dysuria.  Musculoskeletal: Negative for joint swelling.  Skin: Negative for rash.  Neurological: Negative for headaches.  Hematological: Does not bruise/bleed easily.  Psychiatric/Behavioral: Negative for dysphoric mood. The patient is not nervous/anxious.        Objective:   Physical Exam        Assessment & Plan:

## 2015-10-06 DIAGNOSIS — G4733 Obstructive sleep apnea (adult) (pediatric): Secondary | ICD-10-CM | POA: Diagnosis not present

## 2015-10-07 ENCOUNTER — Encounter: Payer: Self-pay | Admitting: Pulmonary Disease

## 2015-10-07 ENCOUNTER — Telehealth: Payer: Self-pay | Admitting: Pulmonary Disease

## 2015-10-07 DIAGNOSIS — G4733 Obstructive sleep apnea (adult) (pediatric): Secondary | ICD-10-CM | POA: Insufficient documentation

## 2015-10-07 HISTORY — DX: Obstructive sleep apnea (adult) (pediatric): G47.33

## 2015-10-07 NOTE — Telephone Encounter (Signed)
HST 10/06/15 >> AHI 17.4, SaO2 low 85%.   Will have my nurse inform pt that she has moderate obstructive sleep apnea.  Please schedule ROV with me or NP to discuss treatment options >> CPAP versus oral appliance.

## 2015-10-08 ENCOUNTER — Other Ambulatory Visit: Payer: Self-pay | Admitting: *Deleted

## 2015-10-08 DIAGNOSIS — R0683 Snoring: Secondary | ICD-10-CM

## 2015-10-08 DIAGNOSIS — G4733 Obstructive sleep apnea (adult) (pediatric): Secondary | ICD-10-CM | POA: Diagnosis not present

## 2015-10-11 NOTE — Telephone Encounter (Signed)
lmtcb x1 

## 2015-10-18 NOTE — Telephone Encounter (Signed)
lmtcb X2 for pt.  

## 2015-10-25 NOTE — Telephone Encounter (Signed)
Patient returning call- She can be reached at [phone]

## 2015-10-27 NOTE — Telephone Encounter (Signed)
Results have been explained to patient, pt expressed understanding. Patient scheduled for appt with VS on 9/29 at 4pm. Nothing further needed.

## 2015-10-27 NOTE — Telephone Encounter (Signed)
LMTCB

## 2015-10-27 NOTE — Telephone Encounter (Signed)
Patient called back regarding results - she can be reached at [phone]

## 2015-10-29 ENCOUNTER — Encounter: Payer: Self-pay | Admitting: Pulmonary Disease

## 2015-10-29 ENCOUNTER — Ambulatory Visit (INDEPENDENT_AMBULATORY_CARE_PROVIDER_SITE_OTHER): Payer: Managed Care, Other (non HMO) | Admitting: Pulmonary Disease

## 2015-10-29 VITALS — BP 104/72 | HR 83 | Ht 64.0 in | Wt 167.6 lb

## 2015-10-29 DIAGNOSIS — G4733 Obstructive sleep apnea (adult) (pediatric): Secondary | ICD-10-CM | POA: Diagnosis not present

## 2015-10-29 NOTE — Patient Instructions (Signed)
Will arrange for CPAP set up  Follow up in 3 months 

## 2015-10-29 NOTE — Progress Notes (Signed)
No current outpatient prescriptions on file prior to visit.   No current facility-administered medications on file prior to visit.      Chief Complaint  Patient presents with  . Follow-up    Review HST.    Sleep tests HST 10/06/15 >> AHI 17.4, SaO2 low 85%.  Past medical hx Allergies  Past surgical hx, Allergies, Family hx, Social hx all reviewed.  Vital Signs BP 104/72 (BP Location: Right Arm, Cuff Size: Normal)   Pulse 83   Ht 5\' 4"  (1.626 m)   Wt 167 lb 9.6 oz (76 kg)   SpO2 98%   BMI 28.77 kg/m   History of Present Illness Rhonda AustinCelia Hagarty is a 55 y.o. female with obstructive sleep apnea.  She is here to review her home sleep study.  This showed moderate sleep apnea.  Physical Exam  General - No distress ENT - No sinus tenderness, no oral exudate, no LAN, MP 4, scalloped tongue Cardiac - s1s2 regular, no murmur Chest - No wheeze/rales/dullness Back - No focal tenderness Abd - Soft, non-tender Ext - No edema Neuro - Normal strength Skin - No rashes Psych - normal mood, and behavior   Assessment/Plan  Obstructive sleep apnea. - We discussed how sleep apnea can affect various health problems, including risks for hypertension, cardiovascular disease, and diabetes.  We also discussed how sleep disruption can increase risks for accidents, such as while driving.  Weight loss as a means of improving sleep apnea was also reviewed.  Additional treatment options discussed were CPAP therapy, oral appliance, and surgical intervention. - will arrange for auto CPAP set up   Patient Instructions  Will arrange for CPAP set up  Follow up in 3 months    Coralyn HellingVineet Lexani Corona, MD [place] [facility]/Sleep Pager:  [phone] 10/29/2015, 4:52 PM

## 2016-02-24 ENCOUNTER — Encounter: Payer: Self-pay | Admitting: Pulmonary Disease

## 2016-02-24 ENCOUNTER — Ambulatory Visit (INDEPENDENT_AMBULATORY_CARE_PROVIDER_SITE_OTHER): Payer: Managed Care, Other (non HMO) | Admitting: Pulmonary Disease

## 2016-02-24 VITALS — BP 116/78 | HR 83 | Ht 64.0 in | Wt 167.0 lb

## 2016-02-24 DIAGNOSIS — Z789 Other specified health status: Secondary | ICD-10-CM

## 2016-02-24 DIAGNOSIS — G4733 Obstructive sleep apnea (adult) (pediatric): Secondary | ICD-10-CM

## 2016-02-24 NOTE — Patient Instructions (Signed)
Will arrange for home sleep study with you using oral appliance  Follow up in 6 months

## 2016-02-24 NOTE — Progress Notes (Signed)
No current outpatient prescriptions on file prior to visit.   No current facility-administered medications on file prior to visit.      Chief Complaint  Patient presents with  . Follow-up    Wears CPAP most night - pt does not wear when she travels. Pt states that she is having hard time tolerating CPAP machine, has tried different masks an is not having much improvement. Pt reports waking throughout the night. DME: [employer] [employer].     Sleep tests HST 10/06/15 >> AHI 17.4, SaO2 low 85%. Auto CPAP 11/10/15 to 11/18/15 >> used on 3 of 9 nights with average 8 hrs 55 min.  Average AHI 2.1 with median CPAP 8 and 95 th percentile CPAP 12 cm H2O  Past medical history Allergies  Past surgical history, Family history, Social history, Allergies reviewed  Vital Signs BP 116/78 (BP Location: Left Arm, Cuff Size: Normal)   Pulse 83   Ht 5\' 4"  (1.626 m)   Wt 167 lb (75.8 kg)   SpO2 99%   BMI 28.67 kg/m   History of Present Illness Rhonda Potts is a [age] y.o. female with obstructive sleep apnea.  She has tried CPAP and has tried several masks.  She is not able to adjust to CPAP.  She got a boil and bite mouth piece on line.  She has been using this, and this has helped her sleep and snoring.  She had this checked by her dentist and thinks oral appliance would be better option.  She travels a fare amount, and carrying CPAP around is cumbersome.  Physical Exam  General - pleasant ENT - scalloped tongue, MP 4, no sinus tenderness Cardiac - regular, no murmur Chest - no wheeze/rales Back - no tenderness Abd - soft, non tender Ext - no edema Neuro - normal strength Skin - no rashes Psych - normal mood   Assessment/Plan  Obstructive sleep apnea. - will arrange for home sleep test with her using boil and bite oral appliance - if her sleep apnea is not adequately controlled would then need to have referral to Dr. Myrtis SerKatz to get more custom fit for oral appliance - will have her CPAP set up discontinued  and picked up by her DME   Patient Instructions  Will arrange for home sleep study with you using oral appliance  Follow up in 6 months    Coralyn HellingVineet Zaki Gertsch, MD [place] [facility]/Sleep Pager:  [phone] 02/24/2016, 11:44 AM

## 2016-03-10 ENCOUNTER — Telehealth: Payer: Self-pay | Admitting: Pulmonary Disease

## 2016-03-10 DIAGNOSIS — G4733 Obstructive sleep apnea (adult) (pediatric): Secondary | ICD-10-CM

## 2016-03-10 NOTE — Telephone Encounter (Signed)
Spoke with pt. States that she wants to discuss her treatment plan from her last OV. Reports that she has been wearing her oral appliance for the past few nights. States that this appliance is causing her lower teeth to hurt. VS wanted pt to have a home sleep study while wearing her current oral appliance. Pt would rather go ahead and be referred to Dr. Myrtis SerKatz to have a custom oral appliance made instead of doing another sleep study with her current oral appliance.  VS - please advise. Thanks.

## 2016-03-10 NOTE — Telephone Encounter (Signed)
Okay to arrange for referral to Dr. Althea GrimmerMark Katz to assess for oral appliance to treat obstructive sleep apnea.

## 2016-03-10 NOTE — Telephone Encounter (Signed)
Spoke with pt. She is aware of VS response. Referral has been placed. Nothing further was needed.

## 2017-01-29 ENCOUNTER — Ambulatory Visit: Payer: Managed Care, Other (non HMO) | Admitting: Sports Medicine

## 2017-01-29 ENCOUNTER — Encounter: Payer: Self-pay | Admitting: Sports Medicine

## 2017-01-29 VITALS — BP 102/71 | Ht 64.0 in | Wt 160.0 lb

## 2017-01-29 DIAGNOSIS — M7741 Metatarsalgia, right foot: Secondary | ICD-10-CM | POA: Diagnosis not present

## 2017-01-29 DIAGNOSIS — M7742 Metatarsalgia, left foot: Secondary | ICD-10-CM

## 2017-01-29 DIAGNOSIS — M722 Plantar fascial fibromatosis: Secondary | ICD-10-CM | POA: Diagnosis not present

## 2017-01-29 DIAGNOSIS — M25579 Pain in unspecified ankle and joints of unspecified foot: Secondary | ICD-10-CM

## 2017-01-29 NOTE — Progress Notes (Signed)
   Subjective:    Patient ID: Rhonda Potts, female    DOB: 1960-04-24, 56 y.o.   MRN: [id number]  HPI chief complaint: Bilateral foot pain  Pleasant 56 year old female comes in today complaining of bilateral foot pain. Pain has been present for several months. No injury that she can recall. She has pain both in the heels as well as in her forefoot, specifically around the metatarsal heads. She has purchased some off-the-shelf orthotics which provide her with fairly good longitudinal arch support and these have been helpful but she is still having significant forefoot pain. Her forefoot pain is most noticeable when walking barefoot or wearing high heels. She denies swelling. She denies numbness or tingling. She denies pain more proximally in the ankle. No prior foot surgeries.  Past medical history reviewed Medications reviewed Allergies reviewed    Review of Systems    as above Objective:   Physical Exam  Well-developed, well-nourished. No acute distress. Awake alert and oriented 3. Vital signs reviewed  Examination of both feet in the standing position shows a fairly well-preserved longitudinal arch. She has collapse of the transverse arch with splaying between her first and second toes, left greater than right. There is some mild callus buildup along the lateral forefoot bilaterally. Slight tenderness to palpation diffusely along the metatarsal heads. She is also tender to palpation at the calcaneal origin of the plantar fascia. Negative calcaneal squeeze. No swelling. Neurovascularly intact distally. Walking without a limp.      Assessment & Plan:   Bilateral foot pain secondary to plantar fasciitis and metatarsalgia  Her current off-the-shelf orthotics are three-quarter length and I wonder if they are contributing to her metatarsal pain. Therefore, I would like to try a green sports insole with a scaphoid pad for longitudinal arch support and a metatarsal pad for transverse arch  support. Patient will follow-up in 4 weeks. If she finds the temporary insert to be comfortable then we will consider custom orthotics at that time. She has no limitations on activity. She may continue with activity using pain as her guide.

## 2017-03-01 ENCOUNTER — Ambulatory Visit (INDEPENDENT_AMBULATORY_CARE_PROVIDER_SITE_OTHER): Payer: Managed Care, Other (non HMO) | Admitting: Sports Medicine

## 2017-03-01 ENCOUNTER — Encounter: Payer: Self-pay | Admitting: Sports Medicine

## 2017-03-01 DIAGNOSIS — M7741 Metatarsalgia, right foot: Secondary | ICD-10-CM

## 2017-03-01 DIAGNOSIS — M7742 Metatarsalgia, left foot: Secondary | ICD-10-CM | POA: Diagnosis not present

## 2017-03-01 NOTE — Progress Notes (Signed)
   Subjective:    Patient ID: Rhonda Potts, female    DOB: 07/27/1960, 57 y.o.   MRN: [id number]  HPI [name] is a 57 year old female who presents for follow up of bilateral arch and metatarsal pain. At her last visit she was provided a pair of green sports insoles with scaphoid and metatarsal pads. Since that visit she reports that she has had significant improvement in her foot pain. However, she wears some pairs of shoes that the insoles do not fit in, and she has increased foot pain on those days. She denies any new foot or ankle problems at this time. Denies any new swelling or tenderness. She is her today to ask questions about custom orthotics and plan for future treatment.    Review of Systems Negative, see above    Objective:   Physical Exam General: Well appearing, no acute distress Feet: Fairly well-preserved longitudinal arches, collapse of transverse arches bilaterally, L>R. Mild callus buildup along the lateral forefoot bilaterally. No swelling, no tenderness to palpation      Assessment & Plan:  57 year old female with a history of bilateral longitudinal and transverse arch pain, recently improved with green sport insoles with scaphoid and metatarsal pads. Patient presented today to ask about custom orthotics. After discussion it was determined that it is unlikely that the custom orthotics would fit in many of her pairs of shoes as she rarely wears tennis shoes and more often wears heels or tight toed boots. Therefore, we discussed continuing to wear green sport insoles at this time. Patient was provided a [employer] catalog and shown which products are currently being used in her insoles. We discussed the process of building her new insoles should her current ones break down or become uncomfortable. The patient is in agreement with this plan of treatment, and will follow up as needed for any further problems.

## 2019-03-04 ENCOUNTER — Ambulatory Visit: Payer: Managed Care, Other (non HMO) | Attending: Internal Medicine

## 2019-03-04 DIAGNOSIS — Z20822 Contact with and (suspected) exposure to covid-19: Secondary | ICD-10-CM

## 2019-03-05 LAB — NOVEL CORONAVIRUS, NAA: SARS-CoV-2, NAA: NOT DETECTED

## 2019-05-21 ENCOUNTER — Ambulatory Visit: Payer: 59 | Attending: Internal Medicine

## 2019-05-21 ENCOUNTER — Other Ambulatory Visit: Payer: Managed Care, Other (non HMO)

## 2019-05-21 DIAGNOSIS — Z20822 Contact with and (suspected) exposure to covid-19: Secondary | ICD-10-CM

## 2019-05-23 LAB — NOVEL CORONAVIRUS, NAA: SARS-CoV-2, NAA: NOT DETECTED

## 2019-05-23 LAB — SARS-COV-2, NAA 2 DAY TAT

## 2019-09-24 ENCOUNTER — Other Ambulatory Visit: Payer: 59

## 2019-09-24 ENCOUNTER — Other Ambulatory Visit: Payer: Self-pay

## 2019-09-24 DIAGNOSIS — Z20822 Contact with and (suspected) exposure to covid-19: Secondary | ICD-10-CM

## 2019-09-26 LAB — SARS-COV-2, NAA 2 DAY TAT

## 2019-09-26 LAB — NOVEL CORONAVIRUS, NAA: SARS-CoV-2, NAA: NOT DETECTED

## 2019-12-10 ENCOUNTER — Other Ambulatory Visit: Payer: Self-pay | Admitting: Family Medicine

## 2019-12-10 DIAGNOSIS — Z1231 Encounter for screening mammogram for malignant neoplasm of breast: Secondary | ICD-10-CM

## 2020-01-20 ENCOUNTER — Ambulatory Visit
Admission: RE | Admit: 2020-01-20 | Discharge: 2020-01-20 | Disposition: A | Discharge status: Home / Self Care | Payer: 59 | Source: Ambulatory Visit | Attending: Family Medicine | Admitting: Family Medicine

## 2020-01-20 ENCOUNTER — Other Ambulatory Visit: Payer: Self-pay

## 2020-01-20 DIAGNOSIS — Z1231 Encounter for screening mammogram for malignant neoplasm of breast: Secondary | ICD-10-CM

## 2020-12-02 ENCOUNTER — Other Ambulatory Visit: Payer: Self-pay | Admitting: Family Medicine

## 2020-12-02 DIAGNOSIS — Z1231 Encounter for screening mammogram for malignant neoplasm of breast: Secondary | ICD-10-CM

## 2021-01-20 ENCOUNTER — Ambulatory Visit
Admission: RE | Admit: 2021-01-20 | Discharge: 2021-01-20 | Disposition: A | Discharge status: Home / Self Care | Payer: 59 | Source: Ambulatory Visit | Attending: Family Medicine | Admitting: Family Medicine

## 2021-01-20 DIAGNOSIS — Z1231 Encounter for screening mammogram for malignant neoplasm of breast: Secondary | ICD-10-CM

## 2021-03-17 DIAGNOSIS — H2513 Age-related nuclear cataract, bilateral: Secondary | ICD-10-CM | POA: Diagnosis not present

## 2021-03-17 DIAGNOSIS — H35371 Puckering of macula, right eye: Secondary | ICD-10-CM | POA: Diagnosis not present

## 2021-03-25 DIAGNOSIS — E65 Localized adiposity: Secondary | ICD-10-CM | POA: Diagnosis not present

## 2021-03-25 DIAGNOSIS — R635 Abnormal weight gain: Secondary | ICD-10-CM | POA: Diagnosis not present

## 2021-06-09 DIAGNOSIS — E669 Obesity, unspecified: Secondary | ICD-10-CM | POA: Diagnosis not present

## 2021-06-09 DIAGNOSIS — E65 Localized adiposity: Secondary | ICD-10-CM | POA: Diagnosis not present

## 2021-09-09 DIAGNOSIS — J301 Allergic rhinitis due to pollen: Secondary | ICD-10-CM | POA: Diagnosis not present

## 2021-09-09 DIAGNOSIS — E65 Localized adiposity: Secondary | ICD-10-CM | POA: Diagnosis not present

## 2021-09-09 DIAGNOSIS — H8112 Benign paroxysmal vertigo, left ear: Secondary | ICD-10-CM | POA: Diagnosis not present

## 2021-09-23 ENCOUNTER — Ambulatory Visit (INDEPENDENT_AMBULATORY_CARE_PROVIDER_SITE_OTHER): Payer: BC Managed Care – PPO | Admitting: Acute Care

## 2021-09-23 ENCOUNTER — Encounter: Payer: Self-pay | Admitting: Acute Care

## 2021-09-23 VITALS — BP 122/70 | HR 78 | Temp 98.1°F | Ht 63.5 in | Wt 137.0 lb

## 2021-09-23 DIAGNOSIS — G4733 Obstructive sleep apnea (adult) (pediatric): Secondary | ICD-10-CM | POA: Diagnosis not present

## 2021-09-23 NOTE — Patient Instructions (Addendum)
It is good to see you today. We will send another referral for prescription and oral device for Moderate sleep apnea therapy to Dr. Henrietta Hoover office. Please use oral device every night to control sleep apnea.  Follow up in 3 months after you get the device to ensure treatment is going well.  We can consider a sleep study after you have the oral device to ensure therapy is effective in controlling your sleep apnea.  Call of you have any questions or concerns. We are always available. Please contact office for sooner follow up if symptoms do not improve or worsen or seek emergency care

## 2021-09-23 NOTE — Progress Notes (Signed)
History of Present Illness Rhonda Potts is a 61 y.o. female with moderate OSA. Followed by Dr. Craige Cotta. Failed CPAP therapy. Plan is for oral appliance.    09/23/2021 Pt. Presents for follow up. She has last seen by Dr. Craige Cotta for sleep apnea in 2017 and 2018. Marland Kitchen She had a previous sleep study in 2017 which confirmed moderate sleep apnea with AHI of 17.4 and a saturation nadir of 85%. She failed CPAP therapy at that time. She opted for a oral  appliance which she was unable to use while she was having oral treatment for a mis-shapen jaw. Her treatment for her jaw has been completed. She is anxious to start her oral therapy. She did go and see Dr. Myrtis Ser, she actually paid for the oral appliance to be fitted in 2018, but  she was told her needed another prescription for the oral appliance as there has been a period of time between the original order and actually being ready to begin treatment with oral appliance.  .   Pt. Lost her job and will lose her insurance at the en of the month. She would lie to get this resolved as quickly as possible.   Her Epworth score is 8. She does not have night time awakening. She does not wake up with am  headaches. She is fatigued during the day.   Test Results: Sleep tests HST 10/06/15 >> AHI 17.4, SaO2 low 85%.      09/23/2021    9:42 AM  Results of the Epworth flowsheet  Sitting and reading 2  Watching TV 1  Sitting, inactive in a public place (e.g. a theatre or a meeting) 0  As a passenger in a car for an hour without a break 1  Lying down to rest in the afternoon when circumstances permit 3  Sitting and talking to someone 0  Sitting quietly after a lunch without alcohol 1  In a car, while stopped for a few minutes in traffic 0  Total score 8        Latest Ref Rng & Units 04/04/2013    7:50 AM  CBC  WBC 4.0 - 10.5 K/uL 5.6   Hemoglobin 12.0 - 15.0 g/dL 41.3   Hematocrit 24.4 - 46.0 % 35.2   Platelets 150 - 400 K/uL 210         No data to display           BNP No results found for: "BNP"  ProBNP No results found for: "PROBNP"  PFT No results found for: "FEV1PRE", "FEV1POST", "FVCPRE", "FVCPOST", "TLC", "DLCOUNC", "PREFEV1FVCRT", "PSTFEV1FVCRT"  No results found.   Past medical hx Past Medical History:  Diagnosis Date   OSA (obstructive sleep apnea) 10/07/2015   Seasonal allergies      Social History   Tobacco Use   Smoking status: Former    Types: Cigarettes    Quit date: 01/31/1988    Years since quitting: 33.6   Smokeless tobacco: Never  Substance Use Topics   Alcohol use: Yes    Alcohol/week: 6.0 standard drinks of alcohol    Types: 6 Glasses of wine per week   Drug use: No    Ms.Waterfield reports that she quit smoking about 33 years ago. Her smoking use included cigarettes. She has never used smokeless tobacco. She reports current alcohol use of about 6.0 standard drinks of alcohol per week. She reports that she does not use drugs.  Tobacco Cessation: Former smoker quit 1990 with an  unknown pack year smoking history   Past surgical hx, Family hx, Social hx all reviewed.  Current Outpatient Medications on File Prior to Visit  Medication Sig   WEGOVY 0.25 MG/0.5ML SOAJ Inject into the skin.   Multiple Vitamins-Minerals (EMERGEN-C IMMUNE PO) Take by mouth. (Patient not taking: Reported on 09/23/2021)   No current facility-administered medications on file prior to visit.     Allergies  Allergen Reactions   Keflex [Cephalexin] Itching and Rash    Review Of Systems:  Constitutional:   + intentional   weight loss, No night sweats,  Fevers, chills, fatigue, or  lassitude.  HEENT:   No headaches,  Difficulty swallowing,  Tooth/dental problems, or  Sore throat,                No sneezing, itching, ear ache, nasal congestion, post nasal drip,   CV:  No chest pain,  Orthopnea, PND, swelling in lower extremities, anasarca, dizziness, palpitations, syncope.   GI  No heartburn, indigestion, abdominal pain, nausea,  vomiting, diarrhea, change in bowel habits, loss of appetite, bloody stools.   Resp: No shortness of breath with exertion or at rest.  No excess mucus, no productive cough,  No non-productive cough,  No coughing up of blood.  No change in color of mucus.  No wheezing.  No chest wall deformity  Skin: no rash or lesions.  GU: no dysuria, change in color of urine, no urgency or frequency.  No flank pain, no hematuria   MS:  No joint pain or swelling.  No decreased range of motion.  No back pain.  Psych:  No change in mood or affect. No depression or anxiety.  No memory loss.   Vital Signs BP 122/70 (BP Location: Left Arm, Patient Position: Sitting, Cuff Size: Normal)   Pulse 78   Temp 98.1 F (36.7 C) (Oral)   Ht 5' 3.5" (1.613 m)   Wt 137 lb (62.1 kg)   LMP 05/13/2014   SpO2 96%   BMI 23.89 kg/m    Physical Exam:  General- No distress,  A&Ox3, pleasant  ENT: No sinus tenderness, TM clear, pale nasal mucosa, no oral exudate,no post nasal drip, no LAN Cardiac: S1, S2, regular rate and rhythm, no murmur Chest: No wheeze/ rales/ dullness; no accessory muscle use, no nasal flaring, no sternal retractions Abd.: Soft Non-tender Ext: No clubbing cyanosis, edema Neuro:  normal strength Skin: No rashes, warm and dry Psych: normal mood and behavior   Assessment/Plan  Moderate Sleep Apnea  Unable to tolerate CPAP therapy Needs another referral for oral appliance Plan We will send another referral for prescription and oral device for Moderate sleep apnea therapy to Dr. Henrietta Hoover office. Please use oral device every night to control sleep apnea.  Follow up in 3 months after you get the device to ensure treatment is going well.  We can consider a sleep study after you have the oral device to ensure therapy is effective in controlling your sleep apnea.  Call of you have any questions or concerns. We are always available. Please contact office for sooner follow up if symptoms do not  improve or worsen or seek emergency care   I spent 35 minutes dedicated to the care of this patient on the date of this encounter to include pre-visit review of records, face-to-face time with the patient discussing conditions above, post visit ordering of testing, clinical documentation with the electronic health record, making appropriate referrals as documented, and communicating necessary information to  the patient's healthcare team.   Bevelyn Ngo, NP 09/23/2021  9:54 AM

## 2021-09-25 NOTE — Progress Notes (Signed)
Reviewed and agree with assessment/plan.   Kalman Nylen, MD [place] [facility] 09/25/2021, 4:16 PM Pager:  [phone]  

## 2021-12-08 ENCOUNTER — Other Ambulatory Visit: Payer: Self-pay | Admitting: Family Medicine

## 2021-12-08 DIAGNOSIS — Z1231 Encounter for screening mammogram for malignant neoplasm of breast: Secondary | ICD-10-CM

## 2022-01-25 ENCOUNTER — Ambulatory Visit: Payer: BC Managed Care – PPO

## 2022-02-21 ENCOUNTER — Ambulatory Visit
Admission: RE | Admit: 2022-02-21 | Discharge: 2022-02-21 | Disposition: A | Discharge status: Home / Self Care | Payer: 59 | Source: Ambulatory Visit | Attending: Family Medicine | Admitting: Family Medicine

## 2022-02-21 DIAGNOSIS — Z1231 Encounter for screening mammogram for malignant neoplasm of breast: Secondary | ICD-10-CM

## 2022-03-23 DIAGNOSIS — H35371 Puckering of macula, right eye: Secondary | ICD-10-CM | POA: Diagnosis not present

## 2022-03-23 DIAGNOSIS — H2513 Age-related nuclear cataract, bilateral: Secondary | ICD-10-CM | POA: Diagnosis not present

## 2023-02-26 ENCOUNTER — Other Ambulatory Visit: Payer: Self-pay | Admitting: Family Medicine

## 2023-02-26 DIAGNOSIS — Z1231 Encounter for screening mammogram for malignant neoplasm of breast: Secondary | ICD-10-CM

## 2023-03-01 ENCOUNTER — Ambulatory Visit
Admission: RE | Admit: 2023-03-01 | Discharge: 2023-03-01 | Disposition: A | Discharge status: Home / Self Care | Payer: Commercial Managed Care - PPO | Source: Ambulatory Visit | Attending: Family Medicine | Admitting: Family Medicine

## 2023-03-01 DIAGNOSIS — Z1231 Encounter for screening mammogram for malignant neoplasm of breast: Secondary | ICD-10-CM

## 2023-03-23 IMAGING — MG MM DIGITAL SCREENING BILAT W/ TOMO AND CAD
8 series · 8 of 24 positions shown · non-contrast
Comparison: Previous exam(s).

CLINICAL DATA: Screening.

EXAM:
DIGITAL SCREENING BILATERAL MAMMOGRAM WITH TOMOSYNTHESIS AND CAD
TECHNIQUE: Bilateral screening digital craniocaudal and mediolateral oblique
mammograms were obtained. Bilateral screening digital breast
tomosynthesis was performed. The images were evaluated with
computer-aided detection.

[L CC synth-2D]
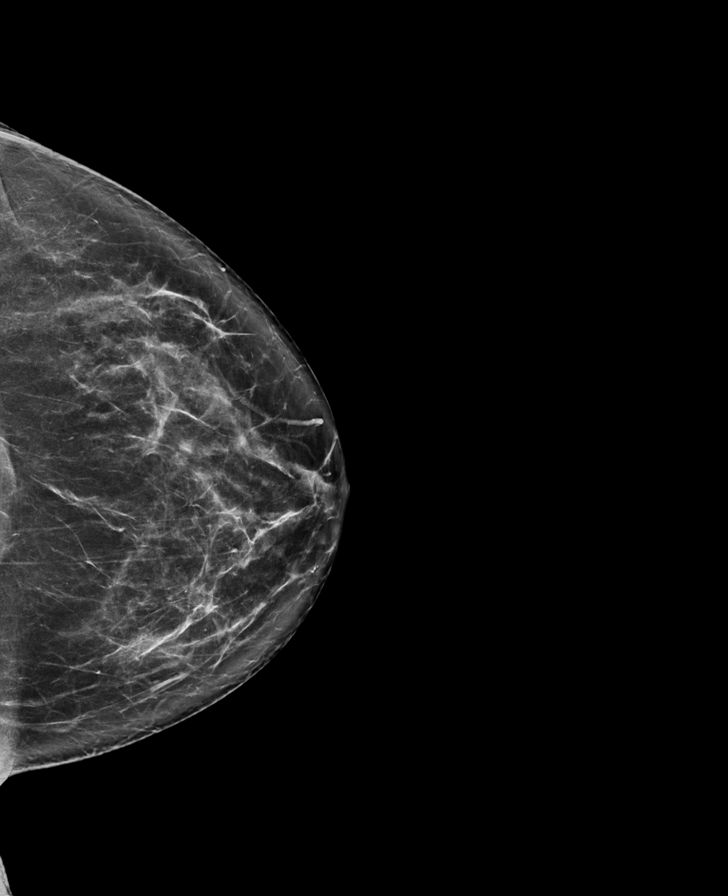

[R MLO synth-2D]
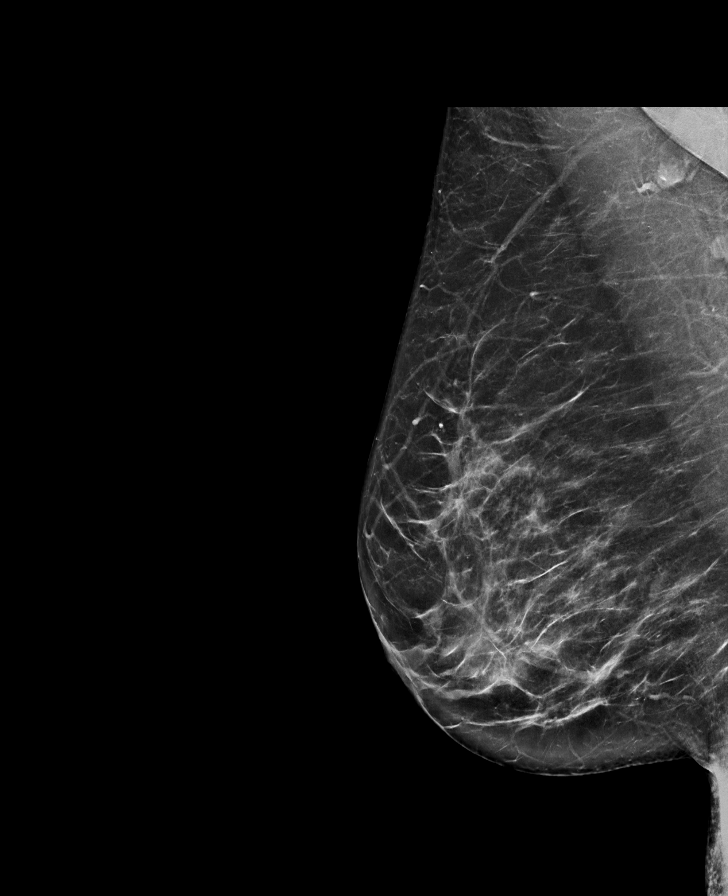

[L MLO synth-2D]
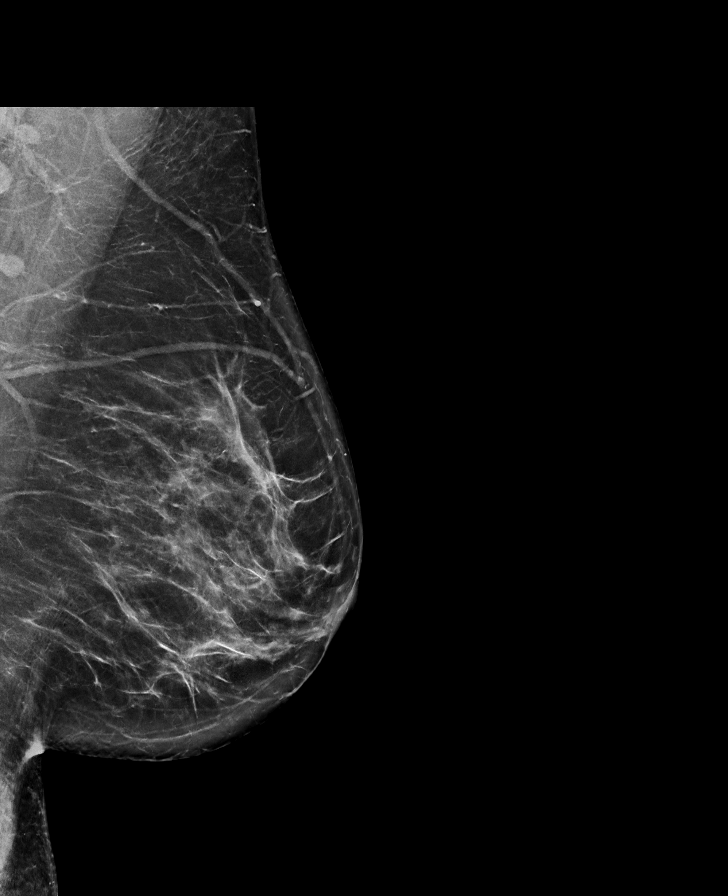

[R CC synth-2D]
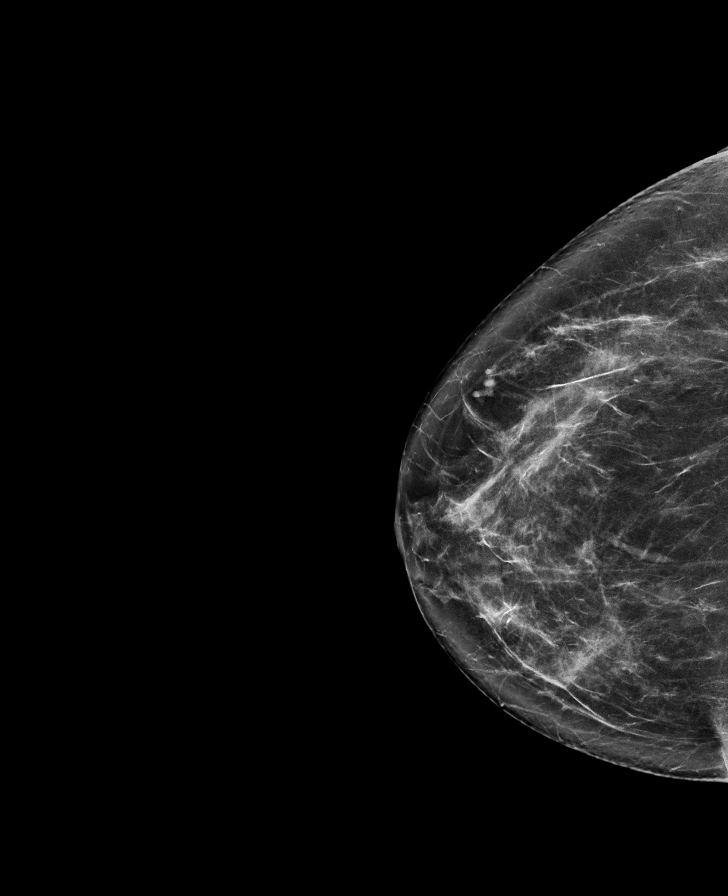

[L CC tomo · tomo slice 40/79.0]
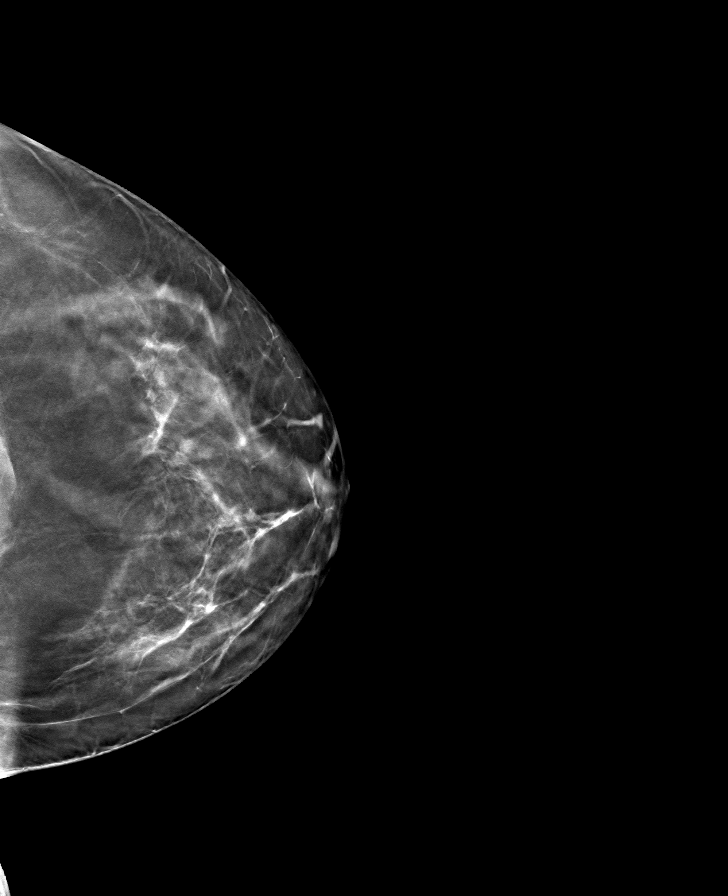

[R CC tomo · tomo slice 39/76.0]
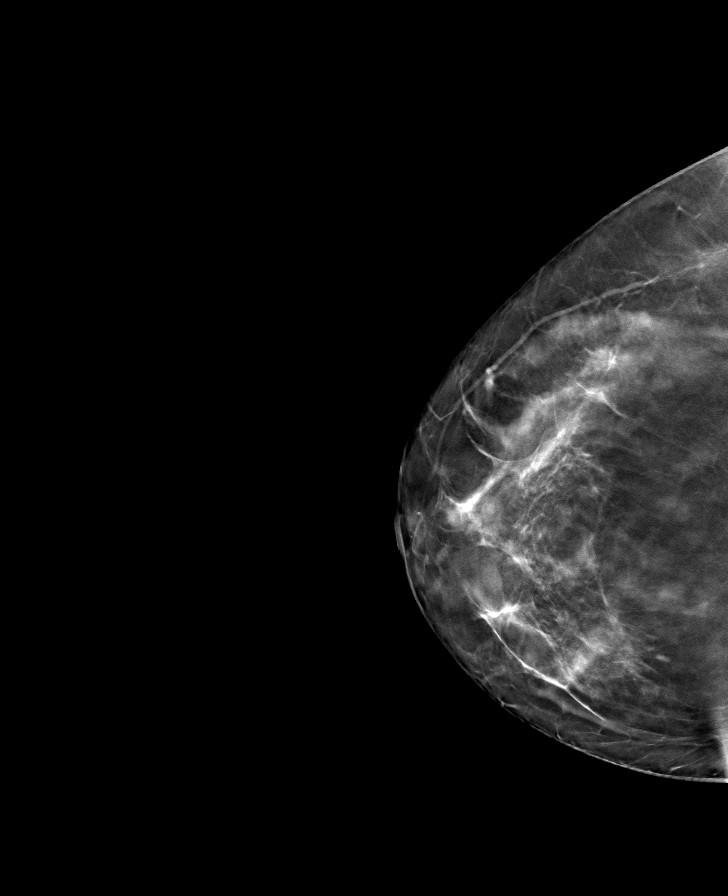

[R MLO tomo · tomo slice 42/83.0]
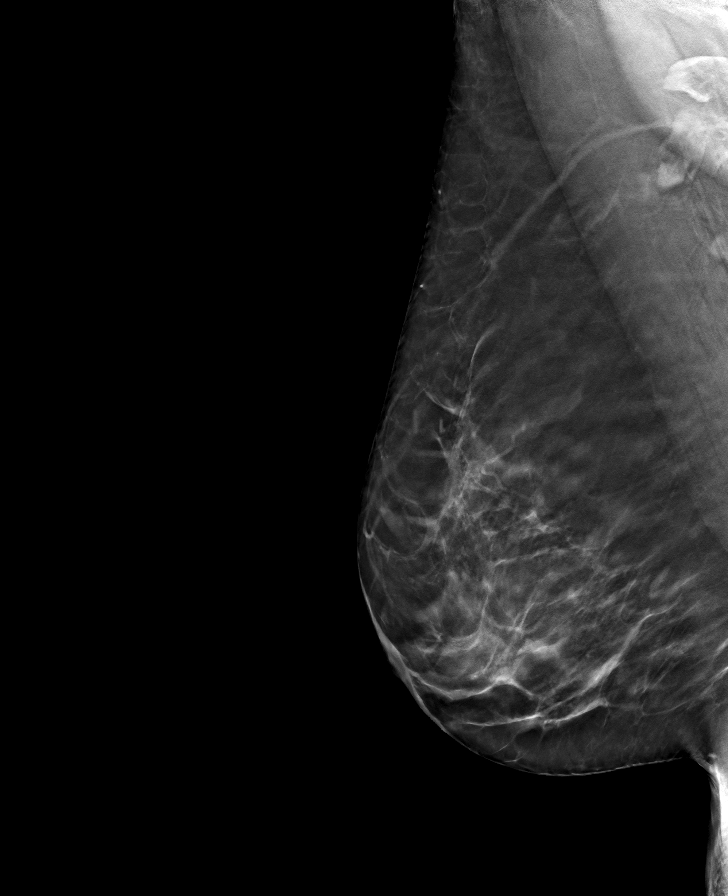

[L MLO tomo · tomo slice 43/85.0]
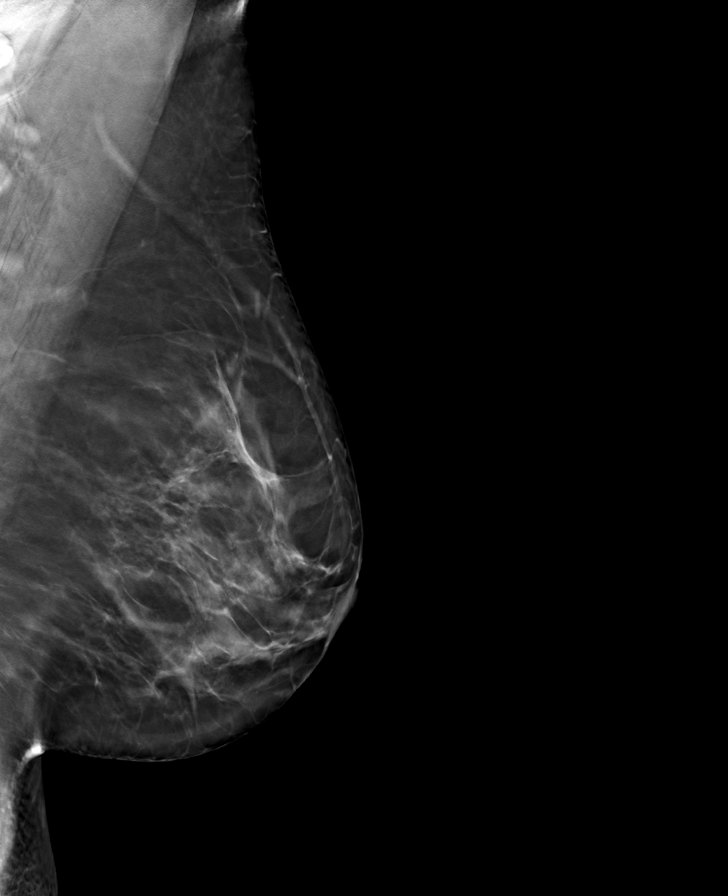

[8 of 24 positions shown; findings below may reference images not displayed]

ACR Breast Density Category c: The breast tissue is heterogeneously
dense, which may obscure small masses.
FINDINGS: There are no findings suspicious for malignancy.
IMPRESSION: No mammographic evidence of malignancy. A result letter of this
screening mammogram will be mailed directly to the patient.

RECOMMENDATION:
Screening mammogram in one year. (Code:Q3-W-BC3)

BI-RADS CATEGORY  1: Negative.

## 2024-02-18 ENCOUNTER — Other Ambulatory Visit: Payer: Self-pay | Admitting: Family Medicine

## 2024-02-18 DIAGNOSIS — Z1231 Encounter for screening mammogram for malignant neoplasm of breast: Secondary | ICD-10-CM

## 2024-03-06 ENCOUNTER — Ambulatory Visit: Admission: RE | Admit: 2024-03-06 | Discharge: 2024-03-06 | Disposition: A | Source: Ambulatory Visit

## 2024-03-06 DIAGNOSIS — Z1231 Encounter for screening mammogram for malignant neoplasm of breast: Secondary | ICD-10-CM
